# Patient Record
Sex: Female | Born: 1965 | Race: White | Hispanic: Yes | Marital: Single | Smoking: Never smoker
Health system: Southern US, Community
[De-identification: ages and names within clinical notes are randomized; demographics above are authoritative.]

## PROBLEM LIST (undated history)

## (undated) DIAGNOSIS — E119 Type 2 diabetes mellitus without complications: Secondary | ICD-10-CM

## (undated) DIAGNOSIS — I10 Essential (primary) hypertension: Secondary | ICD-10-CM

## (undated) HISTORY — DX: Essential (primary) hypertension: I10

## (undated) HISTORY — PX: NO PAST SURGERIES: SHX2092

## (undated) HISTORY — DX: Type 2 diabetes mellitus without complications: E11.9

---

## 2003-01-10 ENCOUNTER — Other Ambulatory Visit: Admission: RE | Admit: 2003-01-10 | Discharge: 2003-01-10 | Payer: Self-pay | Admitting: Gynecology

## 2003-02-07 ENCOUNTER — Encounter: Payer: Self-pay | Admitting: Gynecology

## 2003-02-07 ENCOUNTER — Encounter: Admission: RE | Admit: 2003-02-07 | Discharge: 2003-02-07 | Payer: Self-pay | Admitting: Gynecology

## 2004-09-12 ENCOUNTER — Ambulatory Visit: Payer: Self-pay | Admitting: Family Medicine

## 2004-09-16 ENCOUNTER — Ambulatory Visit: Payer: Self-pay | Admitting: *Deleted

## 2004-12-12 ENCOUNTER — Ambulatory Visit: Payer: Self-pay | Admitting: Family Medicine

## 2005-01-09 ENCOUNTER — Encounter: Admission: RE | Admit: 2005-01-09 | Discharge: 2005-01-09 | Payer: Self-pay | Admitting: Family Medicine

## 2005-08-21 ENCOUNTER — Ambulatory Visit: Payer: Self-pay | Admitting: Family Medicine

## 2011-02-09 ENCOUNTER — Emergency Department (HOSPITAL_COMMUNITY)
Admission: EM | Admit: 2011-02-09 | Discharge: 2011-02-09 | Disposition: A | Discharge status: Home / Self Care | Payer: Self-pay | Attending: Emergency Medicine | Admitting: Emergency Medicine

## 2011-02-09 ENCOUNTER — Emergency Department (HOSPITAL_COMMUNITY): Payer: Self-pay

## 2011-02-09 DIAGNOSIS — I10 Essential (primary) hypertension: Secondary | ICD-10-CM | POA: Insufficient documentation

## 2011-02-09 DIAGNOSIS — Z79899 Other long term (current) drug therapy: Secondary | ICD-10-CM | POA: Insufficient documentation

## 2011-02-09 DIAGNOSIS — E119 Type 2 diabetes mellitus without complications: Secondary | ICD-10-CM | POA: Insufficient documentation

## 2011-02-09 DIAGNOSIS — M7989 Other specified soft tissue disorders: Secondary | ICD-10-CM | POA: Insufficient documentation

## 2011-02-09 DIAGNOSIS — R51 Headache: Secondary | ICD-10-CM | POA: Insufficient documentation

## 2011-02-09 DIAGNOSIS — R209 Unspecified disturbances of skin sensation: Secondary | ICD-10-CM | POA: Insufficient documentation

## 2011-02-09 DIAGNOSIS — D649 Anemia, unspecified: Secondary | ICD-10-CM | POA: Insufficient documentation

## 2011-02-09 DIAGNOSIS — I509 Heart failure, unspecified: Secondary | ICD-10-CM | POA: Insufficient documentation

## 2011-02-09 DIAGNOSIS — R609 Edema, unspecified: Secondary | ICD-10-CM | POA: Insufficient documentation

## 2011-02-09 LAB — COMPREHENSIVE METABOLIC PANEL
Albumin: 3.3 g/dL — ABNORMAL LOW (ref 3.5–5.2)
Chloride: 105 mEq/L (ref 96–112)
GFR calc Af Amer: >90 mL/min (ref >90)
Glucose, Bld: 92 mg/dL (ref 70–99)
Potassium: 3.6 mEq/L (ref 3.5–5.1)
Sodium: 136 mEq/L (ref 135–145)
Total Bilirubin: 0.3 mg/dL (ref 0.3–1.2)
Total Protein: 6.6 g/dL (ref 6.0–8.3)

## 2011-02-09 LAB — DIFFERENTIAL
Basophils Relative: 2 % — ABNORMAL HIGH (ref 0–1)
Eosinophils Relative: 6 % — ABNORMAL HIGH (ref 0–5)
Lymphs Abs: 2.7 10*3/uL (ref 0.7–4.0)
Monocytes Absolute: 0.8 10*3/uL (ref 0.1–1.0)
Neutrophils Relative %: 58 % (ref 43–77)

## 2011-02-09 LAB — CBC
Hemoglobin: 9 g/dL — ABNORMAL LOW (ref 12.0–15.0)
MCH: 19.4 pg — ABNORMAL LOW (ref 26.0–34.0)
MCHC: 33.7 g/dL (ref 30.0–36.0)
MCV: 57.7 fL — ABNORMAL LOW (ref 78.0–100.0)
WBC: 10.3 10*3/uL (ref 4.0–10.5)

## 2012-10-05 IMAGING — CR DG CHEST 2V
2 series · 2 of 2 positions shown · non-contrast
Comparison: None.

CLINICAL DATA: Swelling with headache and numbness for 4 days.
History of hypertension and diabetes.

CHEST - 2 VIEW

[w chest pa]
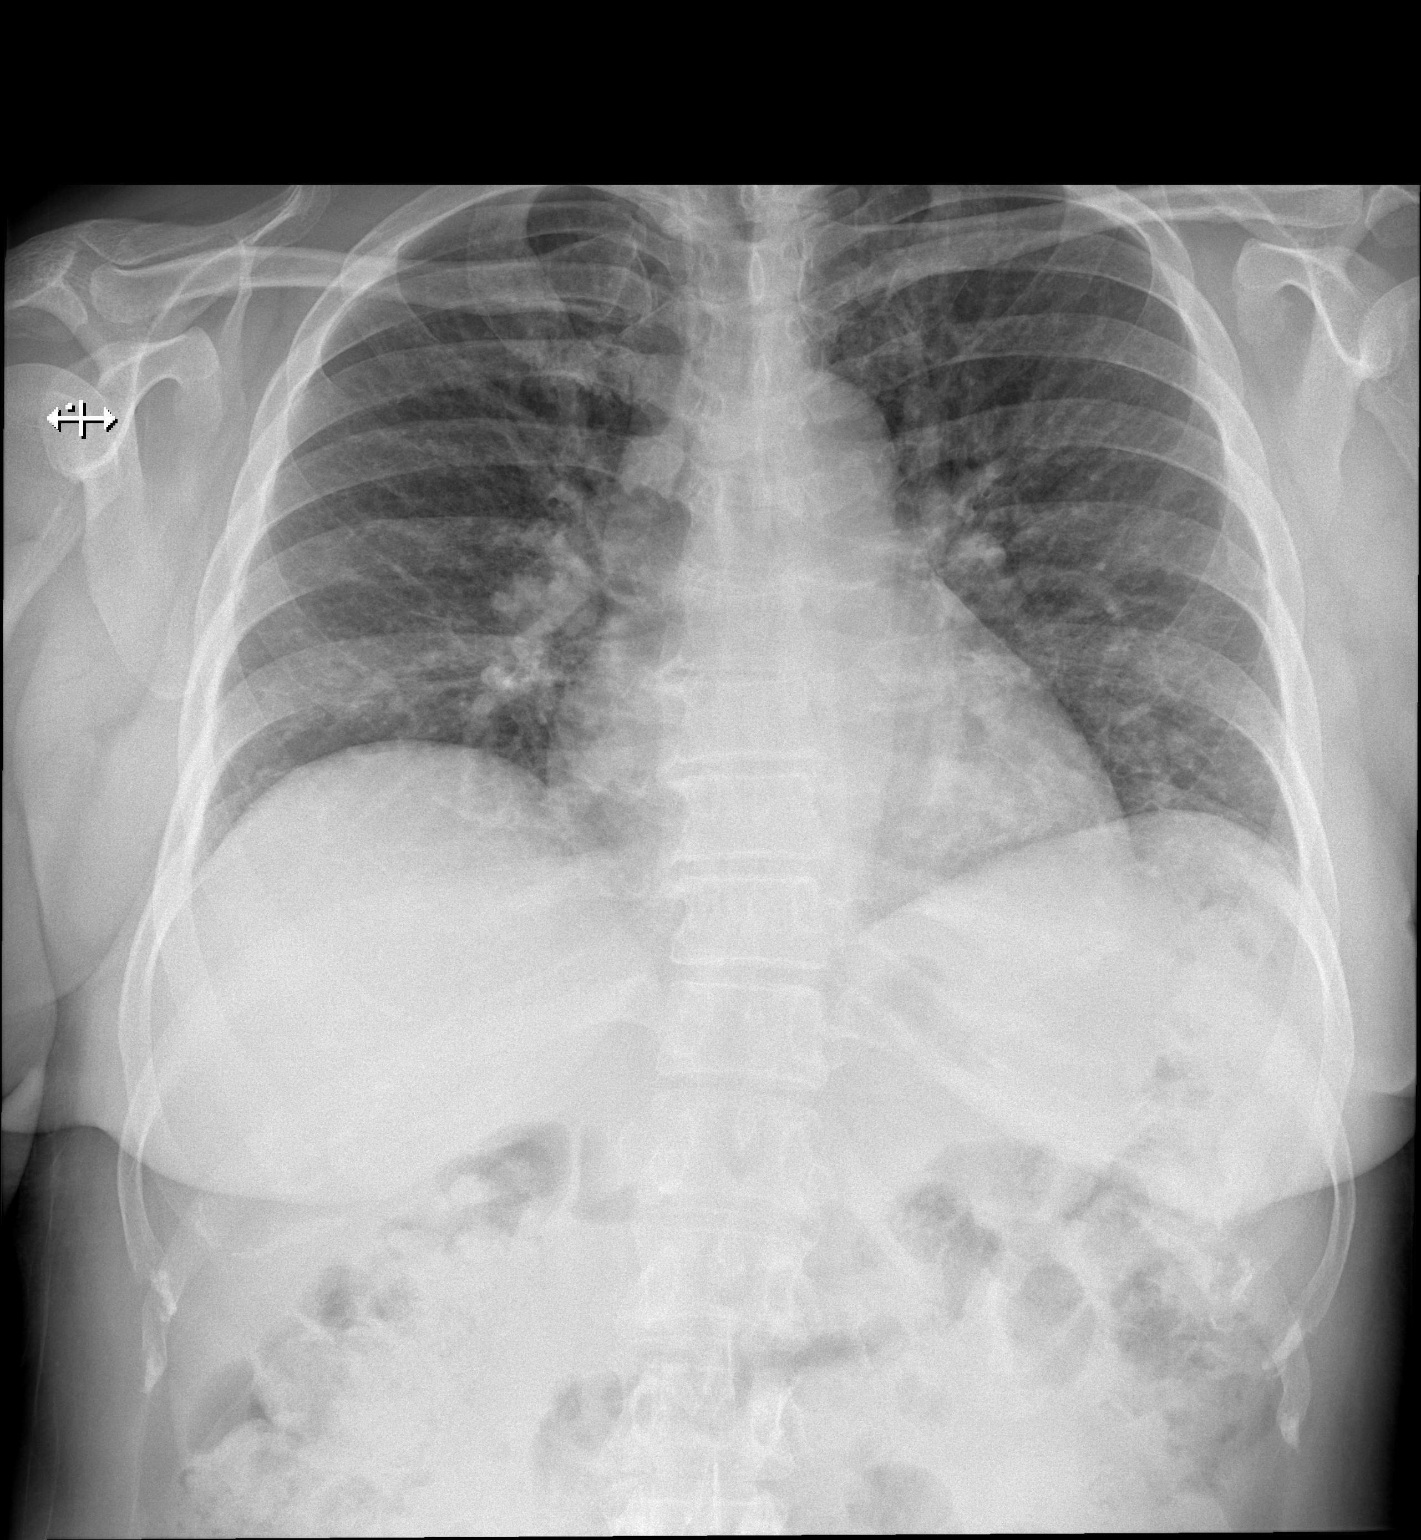

[w chest lat]
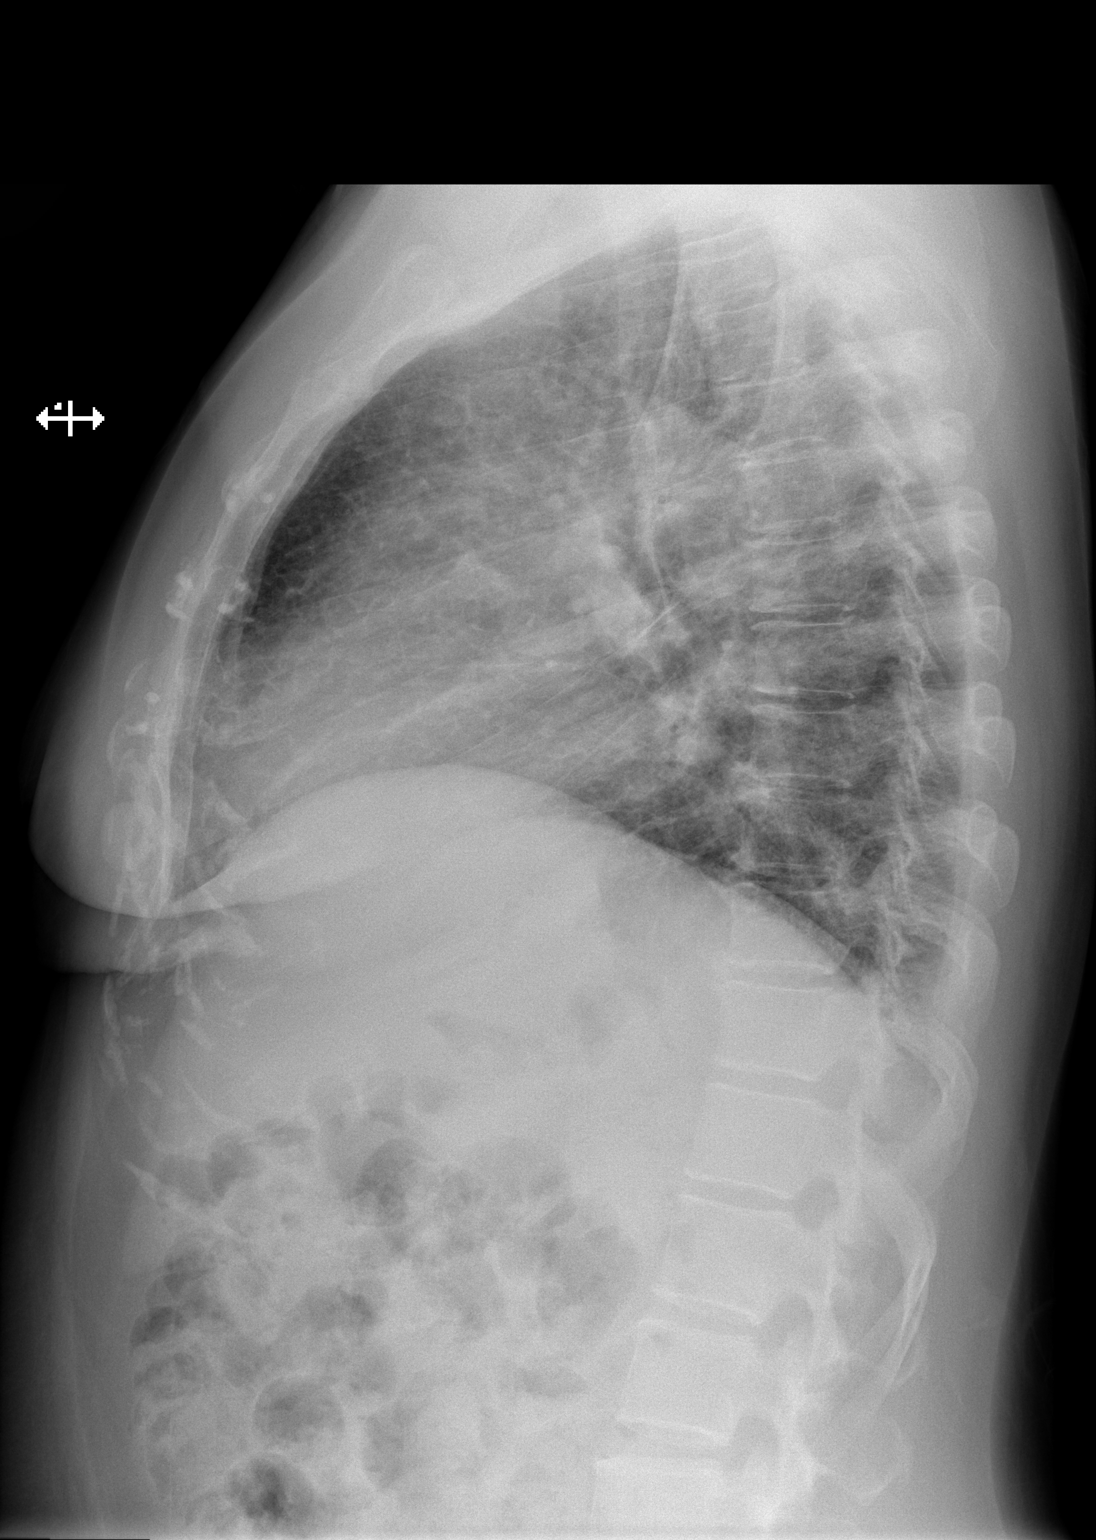

[2 of 2 positions shown; findings below may reference images not displayed]

FINDINGS: The heart size and mediastinal contours are normal.
There is diffuse interstitial prominence with mild engorgement of
the pulmonary vasculature.  There is no confluent airspace opacity
or pleural effusion.  The osseous structures appear normal.
IMPRESSION: Diffuse interstitial prominence could reflect chronic interstitial
lung disease or mild interstitial edema.  There is no confluent
airspace opacity or significant pleural effusion.

## 2012-11-10 ENCOUNTER — Encounter: Payer: Self-pay | Admitting: Obstetrics & Gynecology

## 2012-11-10 ENCOUNTER — Ambulatory Visit (INDEPENDENT_AMBULATORY_CARE_PROVIDER_SITE_OTHER): Payer: Self-pay | Admitting: Obstetrics & Gynecology

## 2012-11-10 VITALS — BP 152/87 | HR 93 | Temp 98.0°F | Ht 60.0 in | Wt 156.0 lb

## 2012-11-10 DIAGNOSIS — Z0000 Encounter for general adult medical examination without abnormal findings: Secondary | ICD-10-CM

## 2012-11-10 DIAGNOSIS — Z01419 Encounter for gynecological examination (general) (routine) without abnormal findings: Secondary | ICD-10-CM

## 2012-11-10 NOTE — Progress Notes (Signed)
  Subjective:    Patient ID: Kathryn Harper, female    DOB: Feb 16, 1966, 47 y.o.   MRN: [id number]  HPI  47 yo SH [id number] who is here with the complaint of a rectocele for the last 8-9 months. She has to splint to help with having a BM.  She reports some mild constipation.  Review of Systems Pap and mammogram due    Objective:   Physical Exam  4th degree rectocele, good sphincter tone      Assessment & Plan:  Symptomatic rectocele Plan for posterior repair in November per her request

## 2012-11-11 ENCOUNTER — Ambulatory Visit (HOSPITAL_COMMUNITY): Payer: Self-pay

## 2012-12-09 ENCOUNTER — Telehealth: Payer: Self-pay | Admitting: Obstetrics & Gynecology

## 2012-12-09 NOTE — Telephone Encounter (Signed)
Phone not on °

## 2012-12-21 ENCOUNTER — Encounter: Payer: Self-pay | Admitting: Obstetrics and Gynecology

## 2013-01-20 ENCOUNTER — Encounter: Payer: Self-pay | Admitting: Obstetrics & Gynecology

## 2013-02-21 ENCOUNTER — Encounter (HOSPITAL_COMMUNITY): Payer: Self-pay

## 2013-02-21 ENCOUNTER — Encounter (HOSPITAL_COMMUNITY)
Admission: RE | Admit: 2013-02-21 | Discharge: 2013-02-21 | Disposition: A | Discharge status: Home / Self Care | Payer: Self-pay | Source: Ambulatory Visit | Attending: Obstetrics & Gynecology | Admitting: Obstetrics & Gynecology

## 2013-02-21 DIAGNOSIS — Z01818 Encounter for other preprocedural examination: Secondary | ICD-10-CM | POA: Insufficient documentation

## 2013-02-21 DIAGNOSIS — Z01812 Encounter for preprocedural laboratory examination: Secondary | ICD-10-CM | POA: Insufficient documentation

## 2013-02-21 DIAGNOSIS — Z01810 Encounter for preprocedural cardiovascular examination: Secondary | ICD-10-CM | POA: Insufficient documentation

## 2013-02-21 LAB — BASIC METABOLIC PANEL
CO2: 25 mEq/L (ref 19–32)
GFR calc Af Amer: >90 mL/min (ref >90)
GFR calc non Af Amer: >90 mL/min (ref >90)
Glucose, Bld: 142 mg/dL — ABNORMAL HIGH (ref 70–99)
Sodium: 136 mEq/L (ref 135–145)

## 2013-02-21 LAB — CBC
HCT: 34.9 % — ABNORMAL LOW (ref 36.0–46.0)
Hemoglobin: 11.8 g/dL — ABNORMAL LOW (ref 12.0–15.0)
MCH: 19.4 pg — ABNORMAL LOW (ref 26.0–34.0)
MCHC: 33.8 g/dL (ref 30.0–36.0)
RBC: 6.09 MIL/uL — ABNORMAL HIGH (ref 3.87–5.11)
RDW: 17.6 % — ABNORMAL HIGH (ref 11.5–15.5)

## 2013-02-21 NOTE — Patient Instructions (Addendum)
Your procedure is scheduled on:02/24/13  Enter through the Main Entrance at :0730 am Pick up desk phone and dial [phone] and inform us of your arrival.  Please call [phone] if you have any problems the morning of surgery.  Remember: Do not eat food or drink liquids, including water, after midnight:WED   You may brush your teeth the morning of surgery.  Take these meds the morning of surgery with a sip of water: blood pressure pill DO NOT TAKE METFORMIN FOR 24 HOURS PRIOR TO SURGERY  DO NOT wear jewelry, eye make-up, lipstick,body lotion, or dark fingernail polish.  (Polished toes are ok) You may wear deodorant.  If you are to be admitted after surgery, leave suitcase in car until your room has been assigned. Patients discharged on the day of surgery will not be allowed to drive home. Wear loose fitting, comfortable clothes for your ride home.

## 2013-02-24 ENCOUNTER — Encounter (HOSPITAL_COMMUNITY): Payer: Self-pay | Admitting: Anesthesiology

## 2013-02-24 ENCOUNTER — Ambulatory Visit (HOSPITAL_COMMUNITY)
Admission: RE | Admit: 2013-02-24 | Discharge: 2013-02-24 | Disposition: A | Discharge status: Home / Self Care | Payer: Self-pay | Source: Ambulatory Visit | Attending: Obstetrics & Gynecology | Admitting: Obstetrics & Gynecology

## 2013-02-24 ENCOUNTER — Encounter (HOSPITAL_COMMUNITY)
Admission: RE | Disposition: A | Discharge status: Home / Self Care | Payer: Self-pay | Source: Ambulatory Visit | Attending: Obstetrics & Gynecology

## 2013-02-24 ENCOUNTER — Ambulatory Visit (HOSPITAL_COMMUNITY): Payer: Self-pay | Admitting: Anesthesiology

## 2013-02-24 DIAGNOSIS — N816 Rectocele: Secondary | ICD-10-CM

## 2013-02-24 HISTORY — PX: RECTOCELE REPAIR: SHX761

## 2013-02-24 LAB — GLUCOSE, CAPILLARY: Glucose-Capillary: 134 mg/dL — ABNORMAL HIGH (ref 70–99)

## 2013-02-24 SURGERY — COLPORRHAPHY, POSTERIOR, FOR RECTOCELE REPAIR
Anesthesia: General | Site: Vagina | Wound class: Clean Contaminated

## 2013-02-24 MED ORDER — MIDAZOLAM HCL 2 MG/2ML IJ SOLN
INTRAMUSCULAR | Status: AC
  Filled 2013-02-24: qty 2

## 2013-02-24 MED ORDER — KETOROLAC TROMETHAMINE 30 MG/ML IJ SOLN
INTRAMUSCULAR | Status: AC
  Filled 2013-02-24: qty 1

## 2013-02-24 MED ORDER — SODIUM CHLORIDE 0.9 % IJ SOLN
INTRAMUSCULAR | Status: AC
  Filled 2013-02-24: qty 50

## 2013-02-24 MED ORDER — PROPOFOL 10 MG/ML IV EMUL
INTRAVENOUS | Status: AC
  Filled 2013-02-24: qty 20

## 2013-02-24 MED ORDER — CEFAZOLIN SODIUM-DEXTROSE 2-3 GM-% IV SOLR
INTRAVENOUS | Status: AC
  Filled 2013-02-24: qty 50

## 2013-02-24 MED ORDER — OXYCODONE-ACETAMINOPHEN 5-325 MG PO TABS: 1.0000 | ORAL_TABLET | ORAL | Status: AC | PRN

## 2013-02-24 MED ORDER — PHENYLEPHRINE 40 MCG/ML (10ML) SYRINGE FOR IV PUSH (FOR BLOOD PRESSURE SUPPORT)
PREFILLED_SYRINGE | INTRAVENOUS | Status: AC
  Filled 2013-02-24: qty 5

## 2013-02-24 MED ORDER — CEFAZOLIN SODIUM-DEXTROSE 2-3 GM-% IV SOLR
2.0000 g | INTRAVENOUS | Status: AC
  Administered 2013-02-24: 2 g via INTRAVENOUS

## 2013-02-24 MED ORDER — ONDANSETRON HCL 4 MG/2ML IJ SOLN
INTRAMUSCULAR | Status: AC
  Filled 2013-02-24: qty 2

## 2013-02-24 MED ORDER — FENTANYL CITRATE 0.05 MG/ML IJ SOLN
INTRAMUSCULAR | Status: DC | PRN
  Administered 2013-02-24 (×2): 50 ug via INTRAVENOUS

## 2013-02-24 MED ORDER — EPHEDRINE SULFATE 50 MG/ML IJ SOLN
INTRAMUSCULAR | Status: DC | PRN
  Administered 2013-02-24: 15 mg via INTRAVENOUS

## 2013-02-24 MED ORDER — 0.9 % SODIUM CHLORIDE (POUR BTL) OPTIME
TOPICAL | Status: DC | PRN
  Administered 2013-02-24: 1000 mL

## 2013-02-24 MED ORDER — SODIUM CHLORIDE 0.9 % IJ SOLN
INTRAMUSCULAR | Status: DC | PRN
  Administered 2013-02-24: 50 mL

## 2013-02-24 MED ORDER — ESTRADIOL 0.1 MG/GM VA CREA
TOPICAL_CREAM | VAGINAL | Status: AC
  Filled 2013-02-24: qty 42.5

## 2013-02-24 MED ORDER — FENTANYL CITRATE 0.05 MG/ML IJ SOLN: 25.0000 ug | INTRAMUSCULAR | Status: DC | PRN

## 2013-02-24 MED ORDER — FENTANYL CITRATE 0.05 MG/ML IJ SOLN
INTRAMUSCULAR | Status: AC
  Filled 2013-02-24: qty 2

## 2013-02-24 MED ORDER — MIDAZOLAM HCL 2 MG/2ML IJ SOLN
INTRAMUSCULAR | Status: DC | PRN
  Administered 2013-02-24: 2 mg via INTRAVENOUS

## 2013-02-24 MED ORDER — METOCLOPRAMIDE HCL 5 MG/ML IJ SOLN
INTRAMUSCULAR | Status: AC
  Administered 2013-02-24: 10 mg via INTRAVENOUS
  Filled 2013-02-24: qty 2

## 2013-02-24 MED ORDER — ONDANSETRON HCL 4 MG/2ML IJ SOLN: 4.0000 mg | Freq: Once | INTRAMUSCULAR | Status: DC | PRN

## 2013-02-24 MED ORDER — BUPIVACAINE HCL (PF) 0.5 % IJ SOLN
INTRAMUSCULAR | Status: DC | PRN
  Administered 2013-02-24: 30 mL

## 2013-02-24 MED ORDER — PROPOFOL 10 MG/ML IV BOLUS
INTRAVENOUS | Status: DC | PRN
  Administered 2013-02-24: 160 mg via INTRAVENOUS

## 2013-02-24 MED ORDER — MEPERIDINE HCL 25 MG/ML IJ SOLN: 6.2500 mg | INTRAMUSCULAR | Status: DC | PRN

## 2013-02-24 MED ORDER — KETOROLAC TROMETHAMINE 30 MG/ML IJ SOLN
INTRAMUSCULAR | Status: DC | PRN
  Administered 2013-02-24: 30 mg via INTRAVENOUS

## 2013-02-24 MED ORDER — PHENYLEPHRINE HCL 10 MG/ML IJ SOLN
INTRAMUSCULAR | Status: DC | PRN
  Administered 2013-02-24 (×2): 80 ug via INTRAVENOUS
  Administered 2013-02-24: 40 ug via INTRAVENOUS
  Administered 2013-02-24: 80 ug via INTRAVENOUS
  Administered 2013-02-24: 120 ug via INTRAVENOUS

## 2013-02-24 MED ORDER — ONDANSETRON HCL 4 MG/2ML IJ SOLN
INTRAMUSCULAR | Status: DC | PRN
  Administered 2013-02-24: 4 mg via INTRAVENOUS

## 2013-02-24 MED ORDER — LACTATED RINGERS IV SOLN
INTRAVENOUS | Status: DC
  Administered 2013-02-24 (×3): via INTRAVENOUS

## 2013-02-24 MED ORDER — METOCLOPRAMIDE HCL 5 MG/ML IJ SOLN
10.0000 mg | Freq: Once | INTRAMUSCULAR | Status: AC
  Administered 2013-02-24: 10 mg via INTRAVENOUS

## 2013-02-24 MED ORDER — LIDOCAINE HCL (CARDIAC) 20 MG/ML IV SOLN
INTRAVENOUS | Status: DC | PRN
  Administered 2013-02-24: 60 mg via INTRAVENOUS
  Administered 2013-02-24: 30 mg via INTRAVENOUS

## 2013-02-24 MED ORDER — LIDOCAINE HCL (CARDIAC) 20 MG/ML IV SOLN
INTRAVENOUS | Status: AC
  Filled 2013-02-24: qty 5

## 2013-02-24 MED ORDER — BUPIVACAINE HCL (PF) 0.5 % IJ SOLN
INTRAMUSCULAR | Status: AC
  Filled 2013-02-24: qty 30

## 2013-02-24 MED ORDER — EPHEDRINE 5 MG/ML INJ
INTRAVENOUS | Status: AC
  Filled 2013-02-24: qty 10

## 2013-02-24 MED ORDER — IBUPROFEN 800 MG PO TABS: 800.0000 mg | ORAL_TABLET | Freq: Three times a day (TID) | ORAL | Status: AC | PRN

## 2013-02-24 SURGICAL SUPPLY — 21 items
BLADE SURG 15 STRL LF C SS BP (BLADE) ×1 IMPLANT
BLADE SURG 15 STRL SS (BLADE) ×2
CLOTH BEACON ORANGE TIMEOUT ST (SAFETY) ×2 IMPLANT
DECANTER SPIKE VIAL GLASS SM (MISCELLANEOUS) ×2 IMPLANT
GAUZE PACKING 1 X5 YD ST (GAUZE/BANDAGES/DRESSINGS) IMPLANT
GAUZE PACKING 2X5 YD STERILE (GAUZE/BANDAGES/DRESSINGS) ×2 IMPLANT
GLOVE BIO SURGEON STRL SZ 6.5 (GLOVE) ×2 IMPLANT
GLOVE ECLIPSE 7.0 STRL STRAW (GLOVE) ×2 IMPLANT
GLOVE SURG SS PI 7.0 STRL IVOR (GLOVE) ×4 IMPLANT
GOWN STRL REIN XL XLG (GOWN DISPOSABLE) ×6 IMPLANT
NEEDLE HYPO 22GX1.5 SAFETY (NEEDLE) IMPLANT
NEEDLE SPNL 18GX3.5 QUINCKE PK (NEEDLE) ×2 IMPLANT
NS IRRIG 1000ML POUR BTL (IV SOLUTION) ×2 IMPLANT
PACK VAGINAL WOMENS (CUSTOM PROCEDURE TRAY) ×2 IMPLANT
SUT VIC AB 0 CT1 27 (SUTURE)
SUT VIC AB 0 CT1 27XBRD ANBCTR (SUTURE) IMPLANT
SUT VIC AB 2-0 CT1 (SUTURE) ×2 IMPLANT
SUT VIC AB 2-0 CT2 27 (SUTURE) ×8 IMPLANT
TOWEL OR 17X24 6PK STRL BLUE (TOWEL DISPOSABLE) ×4 IMPLANT
TRAY FOLEY CATH 14FR (SET/KITS/TRAYS/PACK) ×2 IMPLANT
WATER STERILE IRR 1000ML POUR (IV SOLUTION) ×2 IMPLANT

## 2013-02-24 NOTE — Transfer of Care (Signed)
Immediate Anesthesia Transfer of Care Note  Patient: Kathryn Harper  Procedure(s) Performed: Procedure(s): POSTERIOR REPAIR (RECTOCELE) (N/A)  Patient Location: PACU  Anesthesia Type:General  Level of Consciousness: awake, alert  and patient cooperative  Airway & Oxygen Therapy: Patient Spontanous Breathing and Patient connected to nasal cannula oxygen  Post-op Assessment: Report given to PACU RN and Post -op Vital signs reviewed and stable  Post vital signs: Reviewed and stable  Complications: No apparent anesthesia complications

## 2013-02-24 NOTE — Op Note (Signed)
02/24/2013  11:14 AM  PATIENT:  Kathryn Harper  47 y.o. female  PRE-OPERATIVE DIAGNOSIS:   Symptomatic 4th degree rectocele  POST-OPERATIVE DIAGNOSIS:  same  PROCEDURE:  Procedure(s): POSTERIOR REPAIR (RECTOCELE) (N/A)  SURGEON:  Surgeon(s) and Role:    * Allie Bossier, MD - Primary  PHYSICIAN ASSISTANT:   ASSISTANTS: none   ANESTHESIA:   general  EBL:  Total I/O In: 2000 [I.V.:2000] Out: 355 [Urine:280; Blood:75]  BLOOD ADMINISTERED:none  DRAINS: none   LOCAL MEDICATIONS USED:  MARCAINE     SPECIMEN:  No Specimen  DISPOSITION OF SPECIMEN:  N/A  COUNTS:  YES  TOURNIQUET:  * No tourniquets in log *  DICTATION: .Dragon Dictation  PLAN OF CARE: Discharge to home after PACU  PATIENT DISPOSITION:  PACU - hemodynamically stable.   Delay start of Pharmacological VTE agent (>24hrs) due to surgical blood loss or risk of bleeding: not applicable  The risks benefits, and alternatives of surgery were explained, understood, accepted. All questions were answered. Consents were signed. She was taken to the operating room and general anesthesia was applied without complication. She was placed in the dorsal lithotomy position. Her vagina was prepped and draped in the usual sterile fashion. A Foley catheter was placed which drained clear urine throughout case. I created a solution consisting of 30 cc of 0.5% Marcaine and 50 cc of injectable saline. I used this solution to hydrodissect the vaginal mucosa from the perirectal fascia. I removed a triangular portion of perineal skin I incised with the posterior vaginal mucosa in a vertical fashion up to the posterior cervicovaginal junction. I used Ray-Tec sponges and Metzenbaum scissors to dissect to the excess vaginal mucosa off the underlying rectovaginal fascia. I closed the defect with interrupted 2-0 Vicryl sutures. I then excised the excess vaginal mucosa. I closed the mucosal incision with a running locking 2-0 Vicryl suture.  Excellent hemostasis was noted I closed the perineal skin opening at 1 does with a typical episiotomy repair. Excellent cosmetic results were obtained. I removed her Foley catheter. I was able to place 2 fingers in her vagina. She was extubated and taken to recovery room in stable condition. I did placed packing in her vagina.

## 2013-02-24 NOTE — Anesthesia Postprocedure Evaluation (Signed)
Anesthesia Post Note  Patient: Kathryn Harper  Procedure(s) Performed: Procedure(s) (LRB): POSTERIOR REPAIR (RECTOCELE) (N/A)  Anesthesia type: General  Patient location: PACU  Post pain: Pain level controlled  Post assessment: Post-op Vital signs reviewed  Last Vitals:  Filed Vitals:   02/24/13 1030  BP: 126/67  Pulse: 75  Temp:   Resp: 16    Post vital signs: Reviewed  Level of consciousness: sedated  Complications: No apparent anesthesia complications

## 2013-02-24 NOTE — Anesthesia Preprocedure Evaluation (Signed)
Anesthesia Evaluation  Patient identified by MRN, date of birth, ID band Patient awake    Reviewed: Allergy & Precautions, H&P , Patient's Chart, lab work & pertinent test results  Airway Mallampati: I TM Distance: >3 FB Neck ROM: full    Dental no notable dental hx. (+) Teeth Intact   Pulmonary neg pulmonary ROS,  breath sounds clear to auscultation  Pulmonary exam normal       Cardiovascular hypertension, Pt. on medications     Neuro/Psych negative neurological ROS  negative psych ROS   GI/Hepatic negative GI ROS, Neg liver ROS,   Endo/Other  diabetes, Well Controlled, Gestational  Renal/GU negative Renal ROS  negative genitourinary   Musculoskeletal   Abdominal Normal abdominal exam  (+)   Peds  Hematology negative hematology ROS (+)   Anesthesia Other Findings   Reproductive/Obstetrics negative OB ROS                           Anesthesia Physical Anesthesia Plan  ASA: II  Anesthesia Plan: General   Post-op Pain Management:    Induction: Intravenous  Airway Management Planned: LMA  Additional Equipment:   Intra-op Plan:   Post-operative Plan:   Informed Consent: I have reviewed the patients History and Physical, chart, labs and discussed the procedure including the risks, benefits and alternatives for the proposed anesthesia with the patient or authorized representative who has indicated his/her understanding and acceptance.   History available from chart only  Plan Discussed with: CRNA and Surgeon  Anesthesia Plan Comments:         Anesthesia Quick Evaluation

## 2013-02-24 NOTE — H&P (Signed)
Kathryn Harper is an 47 y.o. SH P5 who is here today for a posterior repair. She report that beginning about a year ago she noticed a bulge in her posterior vagina. She has chronic constipation and has to use splinting. She has been abstinent for about a year.  Pertinent Gynecological History: Menses: flow is light Bleeding: monthly for 3 days Contraception: none DES exposure: denies Blood transfusions: none Sexually transmitted diseases: no past history Previous GYN Procedures: none  Last mammogram: normal Date: 2006 Last pap: normal Date: 2014  OB History: P5   Menstrual History: Menarche age: 68 No LMP recorded.    Past Medical History  Diagnosis Date  . Hypertension   . Diabetes mellitus without complication     Past Surgical History  Procedure Laterality Date  . No past surgeries      No family history on file.  Social History:  reports that she has never smoked. She has never used smokeless tobacco. She reports that she drinks alcohol. She reports that she does not use illicit drugs.  Allergies:  Allergies  Allergen Reactions  . Aspirin     Prescriptions prior to admission  Medication Sig Dispense Refill  . fish oil-omega-3 fatty acids 1000 MG capsule Take 2 g by mouth daily.      Marland Kitchen lisinopril-hydrochlorothiazide (PRINZIDE,ZESTORETIC) 20-12.5 MG per tablet Take 1 tablet by mouth daily.      . metFORMIN (GLUCOPHAGE) 500 MG tablet Take 500 mg by mouth 3 (three) times daily.        ROS  Blood pressure 144/76, pulse 84, temperature 98.8 F (37.1 C), temperature source Oral, resp. rate 18, SpO2 100.00%. Physical Exam Heart- rrr Lungs- CTAB Abd- benign 4th degree rectocele  No results found for this or any previous visit (from the past 24 hour(s)).  No results found.  Assessment/Plan: Symptomatic rectocele- plan for posterior repair. She understands how important it is to use Miralax to have soft BMs.  Kathryn Harper C. 02/24/2013, 8:22 AM

## 2013-02-25 ENCOUNTER — Encounter (HOSPITAL_COMMUNITY): Payer: Self-pay | Admitting: Obstetrics & Gynecology

## 2013-02-25 LAB — GLUCOSE, CAPILLARY: Glucose-Capillary: 116 mg/dL — ABNORMAL HIGH (ref 70–99)

## 2013-04-06 ENCOUNTER — Ambulatory Visit (INDEPENDENT_AMBULATORY_CARE_PROVIDER_SITE_OTHER): Payer: Self-pay | Admitting: Obstetrics & Gynecology

## 2013-04-06 ENCOUNTER — Encounter: Payer: Self-pay | Admitting: Obstetrics & Gynecology

## 2013-04-06 VITALS — BP 144/92 | HR 107 | Temp 98.7°F | Ht 60.0 in | Wt 150.7 lb

## 2013-04-06 DIAGNOSIS — Z09 Encounter for follow-up examination after completed treatment for conditions other than malignant neoplasm: Secondary | ICD-10-CM

## 2013-04-06 NOTE — Progress Notes (Signed)
   Subjective:    Patient ID: Kathryn Harper, female    DOB: Sep 10, 1965, 47 y.o.   MRN: [id number]  HPI  Ms. Marlatt is here for her 6 week po visit after having a posterior repair. She has no problems, having normal BMs. She has not had sex yet. She is happy to return to work.   Review of Systems     Objective:   Physical Exam  Very nice cosmetic effect, no further rectocele      Assessment & Plan:  Post op doing well She may return to work and resume sexual relations. She is filling out the application for the free mammogram

## 2013-05-19 ENCOUNTER — Ambulatory Visit (HOSPITAL_COMMUNITY)
Admission: RE | Admit: 2013-05-19 | Discharge: 2013-05-19 | Disposition: A | Discharge status: Home / Self Care | Payer: Self-pay | Source: Ambulatory Visit | Attending: Obstetrics & Gynecology | Admitting: Obstetrics & Gynecology

## 2013-05-19 DIAGNOSIS — Z0000 Encounter for general adult medical examination without abnormal findings: Secondary | ICD-10-CM

## 2013-05-30 ENCOUNTER — Encounter: Payer: Self-pay | Admitting: *Deleted

## 2014-02-27 ENCOUNTER — Encounter: Payer: Self-pay | Admitting: Obstetrics & Gynecology
